# Patient Record
Sex: Male | Born: 1960 | Race: White | Hispanic: No | Marital: Married | Smoking: Never smoker
Health system: Southern US, Community
[De-identification: ages and names within clinical notes are randomized; demographics above are authoritative.]

## PROBLEM LIST (undated history)

## (undated) DIAGNOSIS — E119 Type 2 diabetes mellitus without complications: Secondary | ICD-10-CM

## (undated) HISTORY — PX: ROTATOR CUFF REPAIR: SHX139

## (undated) HISTORY — DX: Type 2 diabetes mellitus without complications: E11.9

---
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
## 2004-10-30 ENCOUNTER — Ambulatory Visit (HOSPITAL_COMMUNITY): Admission: RE | Admit: 2004-10-30 | Discharge: 2004-10-30 | Payer: Self-pay | Admitting: Orthopedic Surgery

## 2004-10-30 ENCOUNTER — Ambulatory Visit (HOSPITAL_BASED_OUTPATIENT_CLINIC_OR_DEPARTMENT_OTHER): Admission: RE | Admit: 2004-10-30 | Discharge: 2004-10-30 | Payer: Self-pay | Admitting: Orthopedic Surgery

## 2005-02-01 ENCOUNTER — Ambulatory Visit (HOSPITAL_COMMUNITY): Admission: RE | Admit: 2005-02-01 | Discharge: 2005-02-01 | Payer: Self-pay | Admitting: Orthopedic Surgery

## 2005-02-01 ENCOUNTER — Ambulatory Visit (HOSPITAL_BASED_OUTPATIENT_CLINIC_OR_DEPARTMENT_OTHER): Admission: RE | Admit: 2005-02-01 | Discharge: 2005-02-01 | Payer: Self-pay | Admitting: Orthopedic Surgery

## 2005-03-15 HISTORY — PX: ROTATOR CUFF REPAIR: SHX139

## 2012-04-21 ENCOUNTER — Encounter (INDEPENDENT_AMBULATORY_CARE_PROVIDER_SITE_OTHER): Payer: 59 | Admitting: Ophthalmology

## 2012-04-21 DIAGNOSIS — E1039 Type 1 diabetes mellitus with other diabetic ophthalmic complication: Secondary | ICD-10-CM

## 2012-04-21 DIAGNOSIS — E1065 Type 1 diabetes mellitus with hyperglycemia: Secondary | ICD-10-CM

## 2012-04-21 DIAGNOSIS — H43819 Vitreous degeneration, unspecified eye: Secondary | ICD-10-CM

## 2012-04-21 DIAGNOSIS — H2510 Age-related nuclear cataract, unspecified eye: Secondary | ICD-10-CM

## 2012-04-21 DIAGNOSIS — H3581 Retinal edema: Secondary | ICD-10-CM

## 2012-04-21 DIAGNOSIS — E11319 Type 2 diabetes mellitus with unspecified diabetic retinopathy without macular edema: Secondary | ICD-10-CM

## 2012-05-05 ENCOUNTER — Ambulatory Visit (INDEPENDENT_AMBULATORY_CARE_PROVIDER_SITE_OTHER): Payer: 59 | Admitting: Ophthalmology

## 2012-05-05 DIAGNOSIS — E1165 Type 2 diabetes mellitus with hyperglycemia: Secondary | ICD-10-CM

## 2012-05-05 DIAGNOSIS — E1139 Type 2 diabetes mellitus with other diabetic ophthalmic complication: Secondary | ICD-10-CM

## 2012-05-05 DIAGNOSIS — H3581 Retinal edema: Secondary | ICD-10-CM

## 2012-09-08 ENCOUNTER — Ambulatory Visit (INDEPENDENT_AMBULATORY_CARE_PROVIDER_SITE_OTHER): Payer: 59 | Admitting: Ophthalmology

## 2012-09-08 DIAGNOSIS — H2510 Age-related nuclear cataract, unspecified eye: Secondary | ICD-10-CM

## 2012-09-08 DIAGNOSIS — E1039 Type 1 diabetes mellitus with other diabetic ophthalmic complication: Secondary | ICD-10-CM

## 2012-09-08 DIAGNOSIS — E11319 Type 2 diabetes mellitus with unspecified diabetic retinopathy without macular edema: Secondary | ICD-10-CM

## 2012-09-08 DIAGNOSIS — H43819 Vitreous degeneration, unspecified eye: Secondary | ICD-10-CM

## 2012-11-17 ENCOUNTER — Encounter: Payer: Self-pay | Admitting: Gastroenterology

## 2013-01-01 ENCOUNTER — Ambulatory Visit (AMBULATORY_SURGERY_CENTER): Payer: Self-pay | Admitting: *Deleted

## 2013-01-01 VITALS — Ht 69.5 in | Wt 171.2 lb

## 2013-01-01 DIAGNOSIS — Z1211 Encounter for screening for malignant neoplasm of colon: Secondary | ICD-10-CM

## 2013-01-01 MED ORDER — MOVIPREP 100 G PO SOLR: ORAL | Status: DC

## 2013-01-01 NOTE — Progress Notes (Signed)
No allergies to eggs or soy. No problems with anesthesia.  

## 2013-01-02 ENCOUNTER — Encounter: Payer: Self-pay | Admitting: Gastroenterology

## 2013-01-22 ENCOUNTER — Encounter: Payer: Self-pay | Admitting: Gastroenterology

## 2013-01-22 ENCOUNTER — Ambulatory Visit (AMBULATORY_SURGERY_CENTER): Payer: 59 | Admitting: Gastroenterology

## 2013-01-22 VITALS — BP 103/64 | HR 79 | Temp 97.1°F | Resp 18 | Ht 69.5 in | Wt 171.0 lb

## 2013-01-22 DIAGNOSIS — Z1211 Encounter for screening for malignant neoplasm of colon: Secondary | ICD-10-CM

## 2013-01-22 LAB — GLUCOSE, CAPILLARY
Glucose-Capillary: 110 mg/dL — ABNORMAL HIGH (ref 70–99)
Glucose-Capillary: 105 mg/dL — ABNORMAL HIGH (ref 70–99)

## 2013-01-22 MED ORDER — SODIUM CHLORIDE 0.9 % IV SOLN: 500.0000 mL | INTRAVENOUS | Status: DC

## 2013-01-22 NOTE — Progress Notes (Signed)
Blood pressure dropped to 85/51.  IV fluids at wide open drip.  Pt warm, dry and pink.  Retook b/p remained the same 85/51.  Maw

## 2013-01-22 NOTE — Op Note (Signed)
[facility] [address] [place], [address]   COLONOSCOPY PROCEDURE REPORT  PATIENT: Dale Newton, Dale Newton  MR#: [id number] BIRTHDATE: [birth year] , 52  yrs. old GENDER: Male ENDOSCOPIST: Rachael Fee, MD REFERRED XB:JYNWG Link Snuffer, M.D. PROCEDURE DATE:  01/22/2013 PROCEDURE:   Colonoscopy, screening First Screening Colonoscopy - Avg.  risk and is 50 yrs.  old or older Yes.  Prior Negative Screening - Now for repeat screening. N/A  History of Adenoma - Now for follow-up colonoscopy & has been > or = to 3 yrs.  N/A  Polyps Removed Today? No.  Recommend repeat exam, <10 yrs? No. ASA CLASS:   Class II INDICATIONS:average risk screening. MEDICATIONS: Versed 6 mg IV, Fentanyl 75 mcg IV, and These medications were titrated to patient response per physician's verbal order  DESCRIPTION OF PROCEDURE:   After the risks benefits and alternatives of the procedure were thoroughly explained, informed consent was obtained.  A digital rectal exam revealed no abnormalities of the rectum.   The LB [id number]  endoscope was introduced through the anus and advanced to the cecum, which was identified by both the appendix and ileocecal valve. No adverse events experienced.   The quality of the prep was good.  The instrument was then slowly withdrawn as the colon was fully examined.  COLON FINDINGS: A normal appearing cecum, ileocecal valve, and appendiceal orifice were identified.  The ascending, hepatic flexure, transverse, splenic flexure, descending, sigmoid colon and rectum appeared unremarkable.  No polyps or cancers were seen. Retroflexed views revealed no abnormalities. The time to cecum=2 minutes 42 seconds.  Withdrawal time=7 minutes 04 seconds.  The scope was withdrawn and the procedure completed. COMPLICATIONS: There were no complications.  ENDOSCOPIC IMPRESSION: Normal colon No polyps or cancers  RECOMMENDATIONS: You should continue to follow colorectal cancer  screening guidelines for "routine risk" patients with a repeat colonoscopy in 10 years.   eSigned:  Rachael Fee, MD 01/22/2013 8:51 AM

## 2013-01-22 NOTE — Progress Notes (Signed)
Patient did not experience any of the following events: a burn prior to discharge; a fall within the facility; wrong site/side/patient/procedure/implant event; or a hospital transfer or hospital admission upon discharge from the facility. (G8907) Patient did not have preoperative order for IV antibiotic SSI prophylaxis. (G8918)  

## 2013-01-22 NOTE — Patient Instructions (Signed)
Discharge instructions given with verbal understanding. Normal exam. Resume previous medications. YOU HAD AN ENDOSCOPIC PROCEDURE TODAY AT THE [facility]: Refer to the procedure report that was given to you for any specific questions about what was found during the examination.  If the procedure report does not answer your questions, please call your gastroenterologist to clarify.  If you requested that your care partner not be given the details of your procedure findings, then the procedure report has been included in a sealed envelope for you to review at your convenience later.  YOU SHOULD EXPECT: Some feelings of bloating in the abdomen. Passage of more gas than usual.  Walking can help get rid of the air that was put into your GI tract during the procedure and reduce the bloating. If you had a lower endoscopy (such as a colonoscopy or flexible sigmoidoscopy) you may notice spotting of blood in your stool or on the toilet paper. If you underwent a bowel prep for your procedure, then you may not have a normal bowel movement for a few days.  DIET: Your first meal following the procedure should be a light meal and then it is ok to progress to your normal diet.  A half-sandwich or bowl of soup is an example of a good first meal.  Heavy or fried foods are harder to digest and may make you feel nauseous or bloated.  Likewise meals heavy in dairy and vegetables can cause extra gas to form and this can also increase the bloating.  Drink plenty of fluids but you should avoid alcoholic beverages for 24 hours.  ACTIVITY: Your care partner should take you home directly after the procedure.  You should plan to take it easy, moving slowly for the rest of the day.  You can resume normal activity the day after the procedure however you should NOT DRIVE or use heavy machinery for 24 hours (because of the sedation medicines used during the test).    SYMPTOMS TO REPORT IMMEDIATELY: A gastroenterologist  can be reached at any hour.  During normal business hours, 8:30 AM to 5:00 PM Monday through Friday, call [phone].  After hours and on weekends, please call the GI answering service at [phone] who will take a message and have the physician on call contact you.   Following lower endoscopy (colonoscopy or flexible sigmoidoscopy):  Excessive amounts of blood in the stool  Significant tenderness or worsening of abdominal pains  Swelling of the abdomen that is new, acute  Fever of 100F or higher  FOLLOW UP: If any biopsies were taken you will be contacted by phone or by letter within the next 1-3 weeks.  Call your gastroenterologist if you have not heard about the biopsies in 3 weeks.  Our staff will call the home number listed on your records the next business day following your procedure to check on you and address any questions or concerns that you may have at that time regarding the information given to you following your procedure. This is a courtesy call and so if there is no answer at the home number and we have not heard from you through the emergency physician on call, we will assume that you have returned to your regular daily activities without incident.  SIGNATURES/CONFIDENTIALITY: You and/or your care partner have signed paperwork which will be entered into your electronic medical record.  These signatures attest to the fact that that the information above on your After Visit Summary has been reviewed   and is understood.  Full responsibility of the confidentiality of this discharge information lies with you and/or your care-partner. 

## 2013-01-22 NOTE — Progress Notes (Signed)
Last blood pressure before transported to the recovery room 95/45.  W,D,P still. Pt tolerated the colonoscopy well. Maw

## 2013-01-23 ENCOUNTER — Telehealth: Payer: Self-pay | Admitting: *Deleted

## 2013-01-23 NOTE — Telephone Encounter (Signed)
  Follow up Call-  Call back number 01/22/2013  Post procedure Call Back phone  # [phone] OK TO SPEAK WITH WIFE  Permission to leave phone message Yes     Left message on answering machine to call us back if experiencing problems or has questions

## 2013-03-30 ENCOUNTER — Ambulatory Visit (INDEPENDENT_AMBULATORY_CARE_PROVIDER_SITE_OTHER): Payer: 59 | Admitting: Ophthalmology

## 2013-03-30 DIAGNOSIS — E11319 Type 2 diabetes mellitus with unspecified diabetic retinopathy without macular edema: Secondary | ICD-10-CM

## 2013-03-30 DIAGNOSIS — H43819 Vitreous degeneration, unspecified eye: Secondary | ICD-10-CM

## 2013-03-30 DIAGNOSIS — E1165 Type 2 diabetes mellitus with hyperglycemia: Secondary | ICD-10-CM

## 2013-03-30 DIAGNOSIS — H2510 Age-related nuclear cataract, unspecified eye: Secondary | ICD-10-CM

## 2013-03-30 DIAGNOSIS — E1139 Type 2 diabetes mellitus with other diabetic ophthalmic complication: Secondary | ICD-10-CM

## 2013-09-28 ENCOUNTER — Ambulatory Visit (INDEPENDENT_AMBULATORY_CARE_PROVIDER_SITE_OTHER): Payer: 59 | Admitting: Ophthalmology

## 2013-09-28 DIAGNOSIS — E1139 Type 2 diabetes mellitus with other diabetic ophthalmic complication: Secondary | ICD-10-CM

## 2013-09-28 DIAGNOSIS — H2510 Age-related nuclear cataract, unspecified eye: Secondary | ICD-10-CM

## 2013-09-28 DIAGNOSIS — E11319 Type 2 diabetes mellitus with unspecified diabetic retinopathy without macular edema: Secondary | ICD-10-CM

## 2013-09-28 DIAGNOSIS — H43819 Vitreous degeneration, unspecified eye: Secondary | ICD-10-CM

## 2013-09-28 DIAGNOSIS — E1165 Type 2 diabetes mellitus with hyperglycemia: Secondary | ICD-10-CM

## 2014-10-04 ENCOUNTER — Ambulatory Visit (INDEPENDENT_AMBULATORY_CARE_PROVIDER_SITE_OTHER): Payer: 59 | Admitting: Ophthalmology

## 2014-10-04 DIAGNOSIS — H43813 Vitreous degeneration, bilateral: Secondary | ICD-10-CM | POA: Diagnosis not present

## 2014-10-04 DIAGNOSIS — E10319 Type 1 diabetes mellitus with unspecified diabetic retinopathy without macular edema: Secondary | ICD-10-CM

## 2014-10-04 DIAGNOSIS — E10329 Type 1 diabetes mellitus with mild nonproliferative diabetic retinopathy without macular edema: Secondary | ICD-10-CM

## 2015-10-10 ENCOUNTER — Ambulatory Visit (INDEPENDENT_AMBULATORY_CARE_PROVIDER_SITE_OTHER): Payer: 59 | Admitting: Ophthalmology

## 2015-10-10 DIAGNOSIS — E103392 Type 1 diabetes mellitus with moderate nonproliferative diabetic retinopathy without macular edema, left eye: Secondary | ICD-10-CM | POA: Diagnosis not present

## 2015-10-10 DIAGNOSIS — E10319 Type 1 diabetes mellitus with unspecified diabetic retinopathy without macular edema: Secondary | ICD-10-CM

## 2015-10-10 DIAGNOSIS — H43813 Vitreous degeneration, bilateral: Secondary | ICD-10-CM | POA: Diagnosis not present

## 2015-10-10 DIAGNOSIS — E103291 Type 1 diabetes mellitus with mild nonproliferative diabetic retinopathy without macular edema, right eye: Secondary | ICD-10-CM | POA: Diagnosis not present

## 2015-12-06 ENCOUNTER — Emergency Department (HOSPITAL_COMMUNITY): Payer: 59

## 2015-12-06 ENCOUNTER — Encounter (HOSPITAL_COMMUNITY)
Admission: EM | Disposition: A | Discharge status: Home / Self Care | Payer: Self-pay | Source: Home / Self Care | Attending: Otolaryngology

## 2015-12-06 ENCOUNTER — Inpatient Hospital Stay (HOSPITAL_COMMUNITY)
Admission: EM | Admit: 2015-12-06 | Discharge: 2015-12-08 | DRG: 155 | Disposition: A | Discharge status: Home / Self Care | Payer: 59 | Attending: Otolaryngology | Admitting: Otolaryngology

## 2015-12-06 ENCOUNTER — Observation Stay (HOSPITAL_COMMUNITY): Payer: 59 | Admitting: Certified Registered"

## 2015-12-06 ENCOUNTER — Encounter (HOSPITAL_COMMUNITY): Payer: Self-pay | Admitting: *Deleted

## 2015-12-06 DIAGNOSIS — L03211 Cellulitis of face: Secondary | ICD-10-CM

## 2015-12-06 DIAGNOSIS — Z7982 Long term (current) use of aspirin: Secondary | ICD-10-CM

## 2015-12-06 DIAGNOSIS — J340 Abscess, furuncle and carbuncle of nose: Secondary | ICD-10-CM | POA: Diagnosis not present

## 2015-12-06 DIAGNOSIS — IMO0001 Reserved for inherently not codable concepts without codable children: Secondary | ICD-10-CM

## 2015-12-06 DIAGNOSIS — E119 Type 2 diabetes mellitus without complications: Secondary | ICD-10-CM | POA: Diagnosis present

## 2015-12-06 DIAGNOSIS — Z794 Long term (current) use of insulin: Secondary | ICD-10-CM

## 2015-12-06 DIAGNOSIS — Z79899 Other long term (current) drug therapy: Secondary | ICD-10-CM

## 2015-12-06 HISTORY — PX: INCISION AND DRAINAGE ABSCESS: SHX5864

## 2015-12-06 LAB — CBC WITH DIFFERENTIAL/PLATELET
BASOS ABS: 0.1 10*3/uL (ref 0.0–0.1)
Basophils Relative: 0 %
Eosinophils Absolute: 0 10*3/uL (ref 0.0–0.7)
Eosinophils Relative: 0 %
HEMATOCRIT: 47 % (ref 39.0–52.0)
Hemoglobin: 15.8 g/dL (ref 13.0–17.0)
LYMPHS ABS: 2 10*3/uL (ref 0.7–4.0)
LYMPHS PCT: 13 %
MCH: 31.5 pg (ref 26.0–34.0)
MCHC: 33.6 g/dL (ref 30.0–36.0)
MCV: 93.8 fL (ref 78.0–100.0)
Monocytes Absolute: 1.2 10*3/uL — ABNORMAL HIGH (ref 0.1–1.0)
Monocytes Relative: 8 %
NEUTROS ABS: 11.4 10*3/uL — AB (ref 1.7–7.7)
Neutrophils Relative %: 79 %
Platelets: 315 10*3/uL (ref 150–400)
RBC: 5.01 MIL/uL (ref 4.22–5.81)
RDW: 12 % (ref 11.5–15.5)
WBC: 14.7 10*3/uL — AB (ref 4.0–10.5)

## 2015-12-06 LAB — BASIC METABOLIC PANEL
ANION GAP: 14 (ref 5–15)
BUN: 10 mg/dL (ref 6–20)
CHLORIDE: 100 mmol/L — AB (ref 101–111)
CO2: 22 mmol/L (ref 22–32)
Calcium: 9.5 mg/dL (ref 8.9–10.3)
Creatinine, Ser: 1.04 mg/dL (ref 0.61–1.24)
GFR calc Af Amer: >60 mL/min (ref >60)
GFR calc non Af Amer: >60 mL/min (ref >60)
GLUCOSE: 142 mg/dL — AB (ref 65–99)
POTASSIUM: 4.3 mmol/L (ref 3.5–5.1)
Sodium: 136 mmol/L (ref 135–145)

## 2015-12-06 LAB — GLUCOSE, CAPILLARY: GLUCOSE-CAPILLARY: 142 mg/dL — AB (ref 65–99)

## 2015-12-06 LAB — I-STAT CG4 LACTIC ACID, ED
Lactic Acid, Venous: 1.69 mmol/L (ref 0.5–1.9)
Lactic Acid, Venous: 1.83 mmol/L (ref 0.5–1.9)

## 2015-12-06 SURGERY — INCISION AND DRAINAGE, ABSCESS
Anesthesia: General | Site: Nose

## 2015-12-06 MED ORDER — ONDANSETRON HCL 4 MG/2ML IJ SOLN
4.0000 mg | Freq: Once | INTRAMUSCULAR | Status: AC
  Administered 2015-12-06: 4 mg via INTRAVENOUS
  Filled 2015-12-06: qty 2

## 2015-12-06 MED ORDER — OXYCODONE HCL 5 MG/5ML PO SOLN: 5.0000 mg | Freq: Once | ORAL | Status: DC | PRN

## 2015-12-06 MED ORDER — MUPIROCIN 2 % EX OINT
TOPICAL_OINTMENT | Freq: Two times a day (BID) | CUTANEOUS | Status: DC
  Administered 2015-12-07 (×3): via NASAL
  Filled 2015-12-06 (×2): qty 22

## 2015-12-06 MED ORDER — LIDOCAINE-EPINEPHRINE 1 %-1:100000 IJ SOLN
INTRAMUSCULAR | Status: DC | PRN
  Administered 2015-12-06: 3 mL

## 2015-12-06 MED ORDER — PROPOFOL 10 MG/ML IV BOLUS
INTRAVENOUS | Status: AC
  Filled 2015-12-06: qty 20

## 2015-12-06 MED ORDER — INSULIN ASPART 100 UNIT/ML ~~LOC~~ SOLN
0.0000 [IU] | Freq: Every day | SUBCUTANEOUS | Status: DC
  Administered 2015-12-07: 3 [IU] via SUBCUTANEOUS

## 2015-12-06 MED ORDER — ONDANSETRON HCL 4 MG/2ML IJ SOLN: 4.0000 mg | Freq: Once | INTRAMUSCULAR | Status: DC | PRN

## 2015-12-06 MED ORDER — LACTATED RINGERS IV SOLN
INTRAVENOUS | Status: DC | PRN
Start: 2015-12-06 — End: 2015-12-06
  Administered 2015-12-06: 22:00:00 via INTRAVENOUS

## 2015-12-06 MED ORDER — SUCCINYLCHOLINE CHLORIDE 200 MG/10ML IV SOSY
PREFILLED_SYRINGE | INTRAVENOUS | Status: AC
  Filled 2015-12-06: qty 10

## 2015-12-06 MED ORDER — MORPHINE SULFATE (PF) 2 MG/ML IV SOLN: 2.0000 mg | INTRAVENOUS | Status: DC | PRN

## 2015-12-06 MED ORDER — ACETAMINOPHEN 500 MG PO TABS: 1000.0000 mg | ORAL_TABLET | Freq: Every day | ORAL | Status: DC | PRN

## 2015-12-06 MED ORDER — HYDROMORPHONE HCL 1 MG/ML IJ SOLN
0.5000 mg | Freq: Once | INTRAMUSCULAR | Status: AC
Start: 2015-12-06 — End: 2015-12-06
  Administered 2015-12-06: 0.5 mg via INTRAVENOUS
  Filled 2015-12-06: qty 1

## 2015-12-06 MED ORDER — LIDOCAINE-EPINEPHRINE 1 %-1:100000 IJ SOLN
INTRAMUSCULAR | Status: AC
  Filled 2015-12-06: qty 1

## 2015-12-06 MED ORDER — VANCOMYCIN HCL IN DEXTROSE 750-5 MG/150ML-% IV SOLN
750.0000 mg | Freq: Three times a day (TID) | INTRAVENOUS | Status: DC
  Administered 2015-12-07 – 2015-12-08 (×4): 750 mg via INTRAVENOUS
  Filled 2015-12-06 (×6): qty 150

## 2015-12-06 MED ORDER — LIDOCAINE 2% (20 MG/ML) 5 ML SYRINGE
INTRAMUSCULAR | Status: AC
  Filled 2015-12-06: qty 5

## 2015-12-06 MED ORDER — OXYCODONE HCL 5 MG PO TABS: 5.0000 mg | ORAL_TABLET | Freq: Once | ORAL | Status: DC | PRN

## 2015-12-06 MED ORDER — POTASSIUM CHLORIDE IN NACL 20-0.45 MEQ/L-% IV SOLN
INTRAVENOUS | Status: DC
  Administered 2015-12-07 (×2): via INTRAVENOUS
  Filled 2015-12-06 (×3): qty 1000

## 2015-12-06 MED ORDER — SUFENTANIL CITRATE 50 MCG/ML IV SOLN
INTRAVENOUS | Status: DC | PRN
  Administered 2015-12-06 (×2): 5 ug via INTRAVENOUS

## 2015-12-06 MED ORDER — ASPIRIN 81 MG PO TABS: 81.0000 mg | ORAL_TABLET | ORAL | Status: DC

## 2015-12-06 MED ORDER — HYDROMORPHONE HCL 1 MG/ML IJ SOLN
INTRAMUSCULAR | Status: AC
  Filled 2015-12-06: qty 1

## 2015-12-06 MED ORDER — INSULIN ASPART 100 UNIT/ML ~~LOC~~ SOLN
0.0000 [IU] | Freq: Three times a day (TID) | SUBCUTANEOUS | Status: DC
  Administered 2015-12-07: 10 [IU] via SUBCUTANEOUS
  Administered 2015-12-07: 3 [IU] via SUBCUTANEOUS
  Administered 2015-12-07: 7 [IU] via SUBCUTANEOUS
  Administered 2015-12-08: 2 [IU] via SUBCUTANEOUS

## 2015-12-06 MED ORDER — EZETIMIBE 10 MG PO TABS: 10.0000 mg | ORAL_TABLET | Freq: Every day | ORAL | Status: DC

## 2015-12-06 MED ORDER — ONDANSETRON HCL 4 MG/2ML IJ SOLN
INTRAMUSCULAR | Status: DC | PRN
  Administered 2015-12-06: 4 mg via INTRAVENOUS

## 2015-12-06 MED ORDER — MIDAZOLAM HCL 2 MG/2ML IJ SOLN
INTRAMUSCULAR | Status: DC | PRN
  Administered 2015-12-06: 2 mg via INTRAVENOUS

## 2015-12-06 MED ORDER — PIPERACILLIN-TAZOBACTAM 3.375 G IVPB 30 MIN
3.3750 g | Freq: Once | INTRAVENOUS | Status: AC
  Administered 2015-12-06: 3.375 g via INTRAVENOUS
  Filled 2015-12-06: qty 50

## 2015-12-06 MED ORDER — ATORVASTATIN CALCIUM 40 MG PO TABS: 40.0000 mg | ORAL_TABLET | Freq: Every day | ORAL | Status: DC

## 2015-12-06 MED ORDER — HYDROMORPHONE HCL 1 MG/ML IJ SOLN
0.2500 mg | INTRAMUSCULAR | Status: DC | PRN
  Administered 2015-12-06 (×3): 0.5 mg via INTRAVENOUS

## 2015-12-06 MED ORDER — SUFENTANIL CITRATE 50 MCG/ML IV SOLN
INTRAVENOUS | Status: AC
  Filled 2015-12-06: qty 1

## 2015-12-06 MED ORDER — ONDANSETRON HCL 4 MG PO TABS: 4.0000 mg | ORAL_TABLET | ORAL | Status: DC | PRN

## 2015-12-06 MED ORDER — NAPROXEN SODIUM 220 MG PO TABS: 220.0000 mg | ORAL_TABLET | ORAL | Status: DC

## 2015-12-06 MED ORDER — SODIUM CHLORIDE 0.9 % IV BOLUS (SEPSIS)
1000.0000 mL | Freq: Once | INTRAVENOUS | Status: AC
  Administered 2015-12-06: 1000 mL via INTRAVENOUS

## 2015-12-06 MED ORDER — IOPAMIDOL (ISOVUE-300) INJECTION 61%
INTRAVENOUS | Status: AC
  Administered 2015-12-06: 75 mL
  Filled 2015-12-06: qty 75

## 2015-12-06 MED ORDER — ONDANSETRON HCL 4 MG/2ML IJ SOLN
INTRAMUSCULAR | Status: AC
  Filled 2015-12-06: qty 2

## 2015-12-06 MED ORDER — 0.9 % SODIUM CHLORIDE (POUR BTL) OPTIME
TOPICAL | Status: DC | PRN
  Administered 2015-12-06: 200 mL

## 2015-12-06 MED ORDER — ONDANSETRON HCL 4 MG/2ML IJ SOLN: 4.0000 mg | INTRAMUSCULAR | Status: DC | PRN

## 2015-12-06 MED ORDER — OXYCODONE-ACETAMINOPHEN 5-325 MG PO TABS: 2.0000 | ORAL_TABLET | ORAL | Status: DC | PRN

## 2015-12-06 MED ORDER — HYDROCODONE-ACETAMINOPHEN 5-325 MG PO TABS
1.0000 | ORAL_TABLET | ORAL | Status: DC | PRN
  Administered 2015-12-07 (×2): 2 via ORAL
  Administered 2015-12-07: 1 via ORAL
  Administered 2015-12-07: 2 via ORAL
  Filled 2015-12-06 (×4): qty 2

## 2015-12-06 MED ORDER — VANCOMYCIN HCL 10 G IV SOLR
1500.0000 mg | Freq: Once | INTRAVENOUS | Status: AC
  Administered 2015-12-06: 1500 mg via INTRAVENOUS
  Filled 2015-12-06: qty 1500

## 2015-12-06 MED ORDER — PIPERACILLIN-TAZOBACTAM 3.375 G IVPB
3.3750 g | Freq: Three times a day (TID) | INTRAVENOUS | Status: DC
  Administered 2015-12-07 – 2015-12-08 (×4): 3.375 g via INTRAVENOUS
  Filled 2015-12-06 (×6): qty 50

## 2015-12-06 MED ORDER — MIDAZOLAM HCL 2 MG/2ML IJ SOLN
INTRAMUSCULAR | Status: AC
  Filled 2015-12-06: qty 2

## 2015-12-06 SURGICAL SUPPLY — 32 items
ADH SRG 12 PREFL SYR 3 SPRDR (MISCELLANEOUS) ×2
BLADE 15 SAFETY STRL DISP (BLADE) ×2 IMPLANT
CANISTER SUCTION 2500CC (MISCELLANEOUS) ×3 IMPLANT
CATH ROBINSON RED A/P 12FR (CATHETERS) ×2 IMPLANT
COAGULATOR SUCT 8FR VV (MISCELLANEOUS) ×3 IMPLANT
COVER MAYO STAND STRL (DRAPES) ×3 IMPLANT
COVER TABLE BACK 60X90 (DRAPES) ×3 IMPLANT
DECANTER SPIKE VIAL GLASS SM (MISCELLANEOUS) ×2 IMPLANT
DRAIN PENROSE 1/4X12 LTX STRL (WOUND CARE) ×2 IMPLANT
DRAPE ORTHO SPLIT 77X108 STRL (DRAPES) ×3
DRAPE SURG ORHT 6 SPLT 77X108 (DRAPES) ×1 IMPLANT
GAUZE SPONGE 2X2 8PLY STRL LF (GAUZE/BANDAGES/DRESSINGS) ×2 IMPLANT
GAUZE SPONGE 4X4 16PLY XRAY LF (GAUZE/BANDAGES/DRESSINGS) ×3 IMPLANT
GLOVE ECLIPSE 7.5 STRL STRAW (GLOVE) ×3 IMPLANT
GOWN STRL REUS W/ TWL LRG LVL3 (GOWN DISPOSABLE) ×4 IMPLANT
GOWN STRL REUS W/TWL LRG LVL3 (GOWN DISPOSABLE) ×6
KIT BASIN OR (CUSTOM PROCEDURE TRAY) ×3 IMPLANT
KIT ROOM TURNOVER OR (KITS) ×3 IMPLANT
NDL HYPO 25GX1X1/2 BEV (NEEDLE) IMPLANT
NEEDLE HYPO 25GX1X1/2 BEV (NEEDLE) ×3 IMPLANT
NS IRRIG 1000ML POUR BTL (IV SOLUTION) ×3 IMPLANT
PAD ARMBOARD 7.5X6 YLW CONV (MISCELLANEOUS) ×6 IMPLANT
PATTIES SURGICAL .5 X3 (DISPOSABLE) ×3 IMPLANT
PENCIL BUTTON HOLSTER BLD 10FT (ELECTRODE) ×2 IMPLANT
SPONGE GAUZE 2X2 STER 10/PKG (GAUZE/BANDAGES/DRESSINGS) ×1
SUT SILK 2 0 SH (SUTURE) ×2 IMPLANT
SYR 10ML KIT SKIN ADHESIVE (MISCELLANEOUS) ×2 IMPLANT
SYR BULB IRRIGATION 50ML (SYRINGE) ×2 IMPLANT
SYR CONTROL 10ML LL (SYRINGE) IMPLANT
TOWEL OR 17X24 6PK STRL BLUE (TOWEL DISPOSABLE) ×6 IMPLANT
TUBE CONNECTING 12X1/4 (SUCTIONS) ×3 IMPLANT
WATER STERILE IRR 1000ML POUR (IV SOLUTION) ×3 IMPLANT

## 2015-12-06 NOTE — Op Note (Signed)
NAMMarland Kitchen:  Glenna FellowsGIBSON, Jeshua                ACCOUNT NO.:  [id number]  MEDICAL RECORD NO.:  [id number]  LOCATION:  [facility]                         FACILITY:  [facility]  PHYSICIAN:  Antony Contraswight D Safiya Girdler, MD     DATE OF BIRTH:  [birth year]  DATE OF PROCEDURE:  12/06/2015 DATE OF DISCHARGE:                              OPERATIVE REPORT   PREOPERATIVE DIAGNOSIS:  Nasal abscess.  POSTOPERATIVE DIAGNOSIS:  Nasal abscess.  PROCEDURE:  Incision and drainage of nasal abscess.  SURGEON:  Antony Contraswight D Keonta Monceaux, MD  ANESTHESIA:  General LMA anesthesia.  COMPLICATIONS:  None.  INDICATION:  The patient is a 55 year old male, who has developed swelling and pain of the nasal tip over the past week and was started on Bactrim and mupirocin ointment a couple of days ago, but has had progression.  He presents to the emergency department, where a CT scan suggested a fluid pocket in the nasal tip.  He presents to the operating room for surgical management.  FINDINGS:  There was a fluctuant space palpated within the nasal tip and columella, and this was approached from a sublabial direction entering a pocket of thin pus that was able to be drained.  Cultures were sent.  DESCRIPTION OF PROCEDURE:  The patient was identified in the holding room, informed consent having been obtained including discussion of risks, benefits, alternatives, the patient was brought to the operative suite and put on the operative table in a supine position.  Anesthesia was induced, and the patient was intubated by the Anesthesia team with an LMA without difficulty.  The patient had received intravenous antibiotics in the emergency department.  The eyes taped closed.  The mid face was prepped and draped in sterile fashion.  A sublabial site was injected with 1% lidocaine with 1:100,000 of epinephrine.  Incision was made to the left of midline in the gingivobuccal sulcus using Bovie electrocautery.  The submucosal tissues were divided in the same way.   A hemostat was then used to bluntly dissect toward the base of the nasal septum and the pocket of pus was able to be entered in this way.  The hemostat was then passed to either side of the septal cartilage and pus was able to be drained out.  Culture swabs were passed into the cavity and sent for culture.  The space was then copiously irrigated with saline using a red rubber catheter into the space.  A 0.25-inch Penrose drain was then placed into the space from a sublabial direction and secured with a single 2-0 silk suture.  The drain was cut and the throat was suctioned.  He was then returned to anesthesia for wake up and was extubated, and moved to recovery room in stable condition.     Antony Contraswight D Kahealani Yankovich, MD     DDB/MEDQ  D:  12/06/2015  T:  12/06/2015  Job:  [id number]

## 2015-12-06 NOTE — ED Notes (Signed)
Paged Dr. Jenne PaneBates to MillerAbigail, GeorgiaPA

## 2015-12-06 NOTE — Progress Notes (Signed)
Pharmacy Antibiotic Note  Dale Newton is a 55 y.o. male admitted on 12/06/2015 with cellulitis.  Pharmacy has been consulted for vancomycin/zosyn dosing. Recently discharged on Bactrim 2 days ago. Afebrile, wbc 14.7. SCr 1.04 on admit (unsure of baseline), CrCl~88. Estimated weight in the ED ~77kg.  Plan: Zosyn 3.375g IV (30min inf) x1; then 3.375g IV q8h (4h inf) Vanc 1500mg  IV x1; then 750mg  IV q8h Monitor clinical progress, c/s, renal function, abx plan/LOT VT@SS  as indicated     Temp (24hrs), Avg:99 F (37.2 C), Min:99 F (37.2 C), Max:99 F (37.2 C)   Recent Labs Lab 12/06/15 1615 12/06/15 1629 12/06/15 1948  WBC 14.7*  --   --   CREATININE 1.04  --   --   LATICACIDVEN  --  1.69 1.83    CrCl cannot be calculated (Unknown ideal weight.).    No Known Allergies  Antimicrobials this admission: 9/23 vanc >>  9/23 zosyn >>   Dose adjustments this admission:   Microbiology results:   Dale Newton, PharmD, [facility] Clinical Pharmacist Pager [phone] 12/06/2015 8:19 PM

## 2015-12-06 NOTE — ED Provider Notes (Signed)
[facility]-EMERGENCY DEPT Provider Note   CSN: [id number] Arrival date & time: 12/06/15  1601     History   Chief Complaint Chief Complaint  Patient presents with  . Facial Injury    HPI Dale FuLarry Newton is a 55 y.o. male who presents to the ED for nasal infection the patient who presents to the ED with cc of nasal injury and conce for infection. The patient hit his nose with a box at work on Monday . He has had worsening pain and swelling. He saw Dr. Lazarus SalinesWolicki 2 days ago. He had a  Culture and was started on bactrim and mupirocin. The patient has had worsening pain and swelling since starting abx and Came to the emergency department for evaluation. He states that his blood sugars have been running slightly higher. He denies fevers, chills, myalgias. He has had difficulty sleeping because he is unable to breathe through the nose.  HPI  Past Medical History:  Diagnosis Date  . Diabetes (HCC)     There are no active problems to display for this patient.   Past Surgical History:  Procedure Laterality Date  . ROTATOR CUFF REPAIR Left 2006, 2007  . ROTATOR CUFF REPAIR Right 2007  . TONSILLECTOMY  1969       Home Medications    Prior to Admission medications   Medication Sig Start Date End Date Taking? Authorizing Provider  aspirin 81 MG tablet Take 81 mg by mouth daily.    Historical Provider, MD  atorvastatin (LIPITOR) 40 MG tablet Take 40 mg by mouth daily.    Historical Provider, MD  Canagliflozin (INVOKANA) 300 MG TABS Take by mouth daily.    Historical Provider, MD  ezetimibe (ZETIA) 10 MG tablet Take 10 mg by mouth daily.    Historical Provider, MD  insulin glargine (LANTUS) 100 UNIT/ML injection Inject 15 Units into the skin at bedtime.    Historical Provider, MD  insulin regular (NOVOLIN R,HUMULIN R) 100 units/mL injection Inject into the skin 3 (three) times daily before meals. Sliding scale    Historical Provider, MD    Family History Family History  Problem Relation Age  of Onset  . Colon cancer Neg Hx     Social History Social History  Substance Use Topics  . Smoking status: Never Smoker  . Smokeless tobacco: Never Used  . Alcohol use 2.4 oz/week    4 Cans of beer per week     Allergies   Review of patient's allergies indicates no known allergies.   Review of Systems Review of Systems  Ten systems reviewed and are negative for acute change, except as noted in the HPI.   Physical Exam Updated Vital Signs BP 171/81 (BP Location: Right Arm)   Pulse 99   Temp 99 F (37.2 C) (Oral)   Resp 14   SpO2 99%   Physical Exam  Constitutional: He appears well-developed and well-nourished. No distress.  HENT:  Head: Normocephalic.  Nose: Sinus tenderness present.    Mouth/Throat: Uvula is midline and oropharynx is clear and moist. No uvula swelling. No oropharyngeal exudate.  Eyes: Conjunctivae are normal. No scleral icterus.  Neck: Normal range of motion. Neck supple.  Cardiovascular: Normal rate, regular rhythm and normal heart sounds.   Pulmonary/Chest: Effort normal and breath sounds normal. No respiratory distress.  Abdominal: Soft. There is no tenderness.  Musculoskeletal: He exhibits no edema.  Neurological: He is alert.  Skin: Skin is warm and dry. He is not diaphoretic.  Psychiatric: His behavior  is normal.  Nursing note and vitals reviewed.    ED Treatments / Results  Labs (all labs ordered are listed, but only abnormal results are displayed) Labs Reviewed  CBC WITH DIFFERENTIAL/PLATELET - Abnormal; Notable for the following:       Result Value   WBC 14.7 (*)    Neutro Abs 11.4 (*)    Monocytes Absolute 1.2 (*)    All other components within normal limits  BASIC METABOLIC PANEL - Abnormal; Notable for the following:    Chloride 100 (*)    Glucose, Bld 142 (*)    All other components within normal limits  I-STAT CG4 LACTIC ACID, ED  I-STAT CG4 LACTIC ACID, ED    EKG  EKG Interpretation None       Radiology No  results found.  Procedures Procedures (including critical care time)  Medications Ordered in ED Medications - No data to display   Initial Impression / Assessment and Plan / ED Course  I have reviewed the triage vital signs and the nursing notes.  Pertinent labs & imaging results that were available during my care of the patient were reviewed by me and considered in my medical decision making (see chart for details).  Clinical Course  Comment By Time  Patient with leukocytosis, it was 15,000. Glucose is 142 and slightly elevated. Afebrile. Patient receiving vanc and Zosyn. He will need admission for IV antibiotics. CT scan is currently pending. Dr. Jenne Pane will care for the patient asks for hospitalist admission. I have placed a consult to admission at this time. Arthor Captain, PA-C 09/23 2018    Patient with suspected abscess of the nasal septum. Dr. Jenne Pane and I have reviewed the images. Patient will be taken to the OR for drain. He is placed on vancomycin and Zosyn. He is improved with pain medications. No significant decline in his status here over the course of his ED workup. He appears safe for admission at this time Final Clinical Impressions(s) / ED Diagnoses   Final diagnoses:  None    New Prescriptions New Prescriptions   No medications on file     Arthor Captain, PA-C 12/06/15 2117    Lorre Nick, MD 12/09/15 [phone]

## 2015-12-06 NOTE — ED Notes (Signed)
Patient returned from CT

## 2015-12-06 NOTE — Consult Note (Signed)
Reason for Consult:Nasal infection Referring Physician: ER  Dale Newton is an 55 y.o. male.  HPI: 55 year old male with diabetes known to Dr. Erik Obey was struck on nose early this week by a box and later developed swelling and redness.  Dr. Erik Obey evaluated him two days ago and did not feel that there was a drainable abscess at the time but performed a culture and prescribed Bactrim and mupirocin ointment.  Over the past two days, swelling and pain have worsened.  He was told to come to the ER.  Past Medical History:  Diagnosis Date  . Diabetes [facility])     Past Surgical History:  Procedure Laterality Date  . ROTATOR CUFF REPAIR Left 2006, 2007  . ROTATOR CUFF REPAIR Right 2007  . TONSILLECTOMY  1969    Family History  Problem Relation Age of Onset  . Colon cancer Neg Hx     Social History:  reports that he has never smoked. He has never used smokeless tobacco. He reports that he drinks about 2.4 oz of alcohol per week . He reports that he does not use drugs.  Allergies: No Known Allergies  Medications: I have reviewed the patient's current medications.  Results for orders placed or performed during the hospital encounter of 12/06/15 (from the past 48 hour(s))  CBC with Differential     Status: Abnormal   Collection Time: 12/06/15  4:15 PM  Result Value Ref Range   WBC 14.7 (H) 4.0 - 10.5 K/uL   RBC 5.01 4.22 - 5.81 MIL/uL   Hemoglobin 15.8 13.0 - 17.0 g/dL   HCT 47.0 39.0 - 52.0 %   MCV 93.8 78.0 - 100.0 fL   MCH 31.5 26.0 - 34.0 pg   MCHC 33.6 30.0 - 36.0 g/dL   RDW 12.0 11.5 - 15.5 %   Platelets 315 150 - 400 K/uL   Neutrophils Relative % 79 %   Neutro Abs 11.4 (H) 1.7 - 7.7 K/uL   Lymphocytes Relative 13 %   Lymphs Abs 2.0 0.7 - 4.0 K/uL   Monocytes Relative 8 %   Monocytes Absolute 1.2 (H) 0.1 - 1.0 K/uL   Eosinophils Relative 0 %   Eosinophils Absolute 0.0 0.0 - 0.7 K/uL   Basophils Relative 0 %   Basophils Absolute 0.1 0.0 - 0.1 K/uL  Basic metabolic panel      Status: Abnormal   Collection Time: 12/06/15  4:15 PM  Result Value Ref Range   Sodium 136 135 - 145 mmol/L   Potassium 4.3 3.5 - 5.1 mmol/L   Chloride 100 (L) 101 - 111 mmol/L   CO2 22 22 - 32 mmol/L   Glucose, Bld 142 (H) 65 - 99 mg/dL   BUN 10 6 - 20 mg/dL   Creatinine, Ser 1.04 0.61 - 1.24 mg/dL   Calcium 9.5 8.9 - 10.3 mg/dL   GFR calc non Af Amer >60 >60 mL/min   GFR calc Af Amer >60 >60 mL/min    Comment: (NOTE) The eGFR has been calculated using the CKD EPI equation. This calculation has not been validated in all clinical situations. eGFR's persistently <60 mL/min signify possible Chronic Kidney Disease.    Anion gap 14 5 - 15  I-Stat CG4 Lactic Acid, ED     Status: None   Collection Time: 12/06/15  4:29 PM  Result Value Ref Range   Lactic Acid, Venous 1.69 0.5 - 1.9 mmol/L  I-Stat CG4 Lactic Acid, ED     Status: None  Collection Time: 12/06/15  7:48 PM  Result Value Ref Range   Lactic Acid, Venous 1.83 0.5 - 1.9 mmol/L    No results found.  Review of Systems  HENT:       Nasal pain  Eyes: Positive for blurred vision.  All other systems reviewed and are negative.  Blood pressure 166/67, pulse 93, temperature 99 F (37.2 C), temperature source Oral, resp. rate 16, SpO2 99 %. Physical Exam  Constitutional: He is oriented to person, place, and time. He appears well-developed and well-nourished. No distress.  HENT:  Head: Normocephalic and atraumatic.  Right Ear: External ear normal.  Left Ear: External ear normal.  Mouth/Throat: Oropharynx is clear and moist.  External nose erythematous mostly about the tip with marked edema and tenderness of columella.  Some crusting in both nasal passages.  Upper lip with some edema but without fluctuance.  Eyes: Conjunctivae and EOM are normal. Pupils are equal, round, and reactive to light.  Neck: Normal range of motion. Neck supple.  Cardiovascular: Normal rate.   Respiratory: Effort normal.  Musculoskeletal: Normal  range of motion.  Neurological: He is alert and oriented to person, place, and time. No cranial nerve deficit.  Skin: Skin is warm and dry.  Psychiatric: He has a normal mood and affect. His behavior is normal. Judgment and thought content normal.    Assessment/Plan: Nasal tip cellulitis/abscess, diabetes The patient's exam and poor response to oral antibiotics are concerning for abscess and the maxillofacial CT, which I personally reviewed, supports this diagnosis.  I discussed the situation with him and his wife and recommended proceeding with incision and drainage under sedation.  Risks, benefits, and alternatives were discussed and he expressed understanding and agreement.  I recommended post-operative intravenous antibiotic therapy.  Hoang Pettingill 12/06/2015, 8:08 PM

## 2015-12-06 NOTE — ED Triage Notes (Signed)
The pt was struck in the nose with a package at work  His nose has become more red and swollen  He was seen by dr Lazarus Salineswolicki  On thursady he was given antibiotics and a culture was done on his nose  The pt thinks that his nose is hurting and looking worse

## 2015-12-06 NOTE — ED Notes (Signed)
Patient transported to CT 

## 2015-12-06 NOTE — Anesthesia Preprocedure Evaluation (Addendum)
Anesthesia Evaluation  Patient identified by MRN, date of birth, ID band Patient awake    Reviewed: Allergy & Precautions, NPO status , Patient's Chart, lab work & pertinent test results  Airway Mallampati: II  TM Distance: >3 FB Neck ROM: Full    Dental  (+) Teeth Intact, Dental Advisory Given   Pulmonary    breath sounds clear to auscultation       Cardiovascular  Rhythm:Regular Rate:Normal     Neuro/Psych    GI/Hepatic   Endo/Other  diabetes, Well Controlled, Type 2, Oral Hypoglycemic Agents  Renal/GU      Musculoskeletal   Abdominal   Peds  Hematology   Anesthesia Other Findings   Reproductive/Obstetrics                            Anesthesia Physical Anesthesia Plan  ASA: III  Anesthesia Plan: General   Post-op Pain Management:    Induction: Intravenous  Airway Management Planned: LMA  Additional Equipment:   Intra-op Plan:   Post-operative Plan:   Informed Consent: I have reviewed the patients History and Physical, chart, labs and discussed the procedure including the risks, benefits and alternatives for the proposed anesthesia with the patient or authorized representative who has indicated his/her understanding and acceptance.   Dental advisory given  Plan Discussed with: CRNA and Anesthesiologist  Anesthesia Plan Comments:         Anesthesia Quick Evaluation

## 2015-12-06 NOTE — Brief Op Note (Signed)
12/06/2015  11:00 PM  PATIENT:  Dale Newton  55 y.o. male  PRE-OPERATIVE DIAGNOSIS:  nasal abscess   POST-OPERATIVE DIAGNOSIS:  nasal abscess  PROCEDURE:  Procedure(s): INCISION AND DRAINAGE NASAL ABSCESS (N/A)  SURGEON:  Surgeon(s) and Role:    * Christia Readingwight Shanasia Ibrahim, MD - Primary  PHYSICIAN ASSISTANT:   ASSISTANTS: none   ANESTHESIA:   general  EBL:  Total I/O In: 1500 [I.V.:500; IV Piggyback:1000] Out: 15 [Blood:15]  BLOOD ADMINISTERED:none  DRAINS: Penrose drain in the sublabial wound   LOCAL MEDICATIONS USED:  LIDOCAINE   SPECIMEN:  Source of Specimen:  culture from nasal abscess  DISPOSITION OF SPECIMEN:  MICRO  COUNTS:  YES  TOURNIQUET:  * No tourniquets in log *  DICTATION: .Other Dictation: Dictation Number [id number]  PLAN OF CARE: Admit for overnight observation  PATIENT DISPOSITION:  PACU - hemodynamically stable.   Delay start of Pharmacological VTE agent (>24hrs) due to surgical blood loss or risk of bleeding: no

## 2015-12-06 NOTE — Transfer of Care (Signed)
Immediate Anesthesia Transfer of Care Note  Patient: Dale Newton  Procedure(s) Performed: Procedure(s): EXCISION NASAL MASS (N/A)  Patient Location: PACU  Anesthesia Type:General  Level of Consciousness: awake, alert  and oriented  Airway & Oxygen Therapy: Patient Spontanous Breathing and Patient connected to face mask oxygen  Post-op Assessment: Report given to RN and Post -op Vital signs reviewed and stable  Post vital signs: Reviewed and stable  Last Vitals:  Vitals:   12/06/15 2100 12/06/15 2254  BP: 153/66 (!) 169/81  Pulse: 94 (!) 110  Resp: 18 14  Temp:      Last Pain:  Vitals:   12/06/15 2131  TempSrc:   PainSc: 4          Complications: No apparent anesthesia complications

## 2015-12-06 NOTE — Anesthesia Procedure Notes (Signed)
Procedure Name: LMA Insertion Date/Time: 12/06/2015 10:20 PM Performed by: Kismet Facemire S Pre-anesthesia Checklist: Patient identified, Emergency Drugs available, Suction available, Patient being monitored and Timeout performed Patient Re-evaluated:Patient Re-evaluated prior to inductionOxygen Delivery Method: Circle system utilized Preoxygenation: Pre-oxygenation with 100% oxygen Intubation Type: IV induction Ventilation: Mask ventilation without difficulty LMA: LMA inserted LMA Size: 4.0 Number of attempts: 1 Placement Confirmation: positive ETCO2 and breath sounds checked- equal and bilateral Tube secured with: Tape Dental Injury: Teeth and Oropharynx as per pre-operative assessment

## 2015-12-07 DIAGNOSIS — Z794 Long term (current) use of insulin: Secondary | ICD-10-CM

## 2015-12-07 DIAGNOSIS — Z7982 Long term (current) use of aspirin: Secondary | ICD-10-CM | POA: Diagnosis not present

## 2015-12-07 DIAGNOSIS — J340 Abscess, furuncle and carbuncle of nose: Principal | ICD-10-CM

## 2015-12-07 DIAGNOSIS — L03211 Cellulitis of face: Secondary | ICD-10-CM | POA: Diagnosis present

## 2015-12-07 DIAGNOSIS — Z79899 Other long term (current) drug therapy: Secondary | ICD-10-CM | POA: Diagnosis not present

## 2015-12-07 DIAGNOSIS — E119 Type 2 diabetes mellitus without complications: Secondary | ICD-10-CM

## 2015-12-07 LAB — GLUCOSE, CAPILLARY
GLUCOSE-CAPILLARY: 164 mg/dL — AB (ref 65–99)
GLUCOSE-CAPILLARY: 270 mg/dL — AB (ref 65–99)
GLUCOSE-CAPILLARY: 274 mg/dL — AB (ref 65–99)
Glucose-Capillary: 319 mg/dL — ABNORMAL HIGH (ref 65–99)

## 2015-12-07 MED ORDER — ATORVASTATIN CALCIUM 40 MG PO TABS
40.0000 mg | ORAL_TABLET | Freq: Every day | ORAL | Status: DC
  Administered 2015-12-07: 40 mg via ORAL
  Filled 2015-12-07: qty 1

## 2015-12-07 MED ORDER — ASPIRIN EC 81 MG PO TBEC
81.0000 mg | DELAYED_RELEASE_TABLET | Freq: Every day | ORAL | Status: DC
  Administered 2015-12-07: 81 mg via ORAL
  Filled 2015-12-07: qty 1

## 2015-12-07 MED ORDER — INSULIN GLARGINE 100 UNIT/ML ~~LOC~~ SOLN
20.0000 [IU] | Freq: Every day | SUBCUTANEOUS | Status: DC
  Administered 2015-12-07: 20 [IU] via SUBCUTANEOUS
  Filled 2015-12-07 (×3): qty 0.2

## 2015-12-07 MED ORDER — CANAGLIFLOZIN 100 MG PO TABS
100.0000 mg | ORAL_TABLET | Freq: Every day | ORAL | Status: DC
  Administered 2015-12-08: 100 mg via ORAL
  Filled 2015-12-07: qty 1

## 2015-12-07 MED ORDER — EZETIMIBE 10 MG PO TABS
10.0000 mg | ORAL_TABLET | Freq: Every day | ORAL | Status: DC
  Administered 2015-12-07: 10 mg via ORAL
  Filled 2015-12-07 (×2): qty 1

## 2015-12-07 NOTE — Anesthesia Postprocedure Evaluation (Signed)
Anesthesia Post Note  Patient: Anne FuLarry Pelly  Procedure(s) Performed: Procedure(s) (LRB): INCISION AND DRAINAGE NASAL ABSCESS (N/A)  Patient location during evaluation: PACU Anesthesia Type: General Level of consciousness: awake, awake and alert and oriented Pain management: pain level controlled Vital Signs Assessment: post-procedure vital signs reviewed and stable Respiratory status: spontaneous breathing, nonlabored ventilation and respiratory function stable Cardiovascular status: blood pressure returned to baseline Postop Assessment: no headache    Last Vitals:  Vitals:   12/06/15 2330 12/07/15 0002  BP: (!) 165/80 (!) 170/86  Pulse: 100 (!) 106  Resp: 14 18  Temp: 37.6 C (!) 38.8 C    Last Pain:  Vitals:   12/07/15 0551  TempSrc:   PainSc: 0-No pain                 Alane Hanssen COKER
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
## 2015-12-08 ENCOUNTER — Encounter (HOSPITAL_COMMUNITY): Payer: Self-pay | Admitting: Otolaryngology

## 2015-12-08 LAB — GLUCOSE, CAPILLARY: GLUCOSE-CAPILLARY: 145 mg/dL — AB (ref 65–99)

## 2015-12-08 NOTE — Progress Notes (Signed)
Patient discharged to home with instructions. 

## 2015-12-08 NOTE — Discharge Summary (Signed)
Physician Discharge Summary  Patient ID: Anne FuLarry Michelini MRN: [id number] DOB/AGE: [birth year] 54 y.o.  Admit date: 12/06/2015 Discharge date: 12/08/2015  Admission Diagnoses: Nasal abscess, diabetes  Discharge Diagnoses:  Principal Problem:   Nasal septal abscess Active Problems:   Diabetes mellitus, insulin dependent (IDDM), controlled (HCC)   Nasal abscess   Cellulitis of face   Discharged Condition: good  Hospital Course: 55 year old male with diabetes presented to ER with worsening nasal infection in spite of treatment with Bactrim and mupirocin ointment.  CT imaging and exam suggested abscess formation.  He was taken to the operating room for incision and drainage.  See operative note.  A Penrose drain was left in place and he was treated with broad spectrum IV antibiotics.  By POD 2, swelling had come down substantially and tenderness was improving.  He was felt stable for discharge after the drain was removed.  Consults: None  Significant Diagnostic Studies: radiology: CT scan: maxillofacial  Treatments: surgery: Incision and drainage of nasal abscess  Discharge Exam: Blood pressure 132/64, pulse 79, temperature 97.7 F (36.5 C), temperature source Oral, resp. rate 16, SpO2 98 %. General appearance: alert, cooperative and no distress Nose: Columellar fullness improving, less tender, no fluctuance, Penrose drain removed  Disposition: Final discharge disposition not confirmed      Signed: Rishabh Rinkenberger 12/08/2015, 8:52 AM

## 2015-12-12 LAB — AEROBIC/ANAEROBIC CULTURE (SURGICAL/DEEP WOUND)

## 2015-12-12 LAB — AEROBIC/ANAEROBIC CULTURE W GRAM STAIN (SURGICAL/DEEP WOUND)

## 2016-03-26 DIAGNOSIS — Z6825 Body mass index (BMI) 25.0-25.9, adult: Secondary | ICD-10-CM | POA: Diagnosis not present

## 2016-03-26 DIAGNOSIS — E109 Type 1 diabetes mellitus without complications: Secondary | ICD-10-CM | POA: Diagnosis not present

## 2016-03-26 DIAGNOSIS — E1065 Type 1 diabetes mellitus with hyperglycemia: Secondary | ICD-10-CM | POA: Diagnosis not present

## 2016-04-08 DIAGNOSIS — R7309 Other abnormal glucose: Secondary | ICD-10-CM | POA: Diagnosis not present

## 2016-07-02 DIAGNOSIS — E784 Other hyperlipidemia: Secondary | ICD-10-CM | POA: Diagnosis not present

## 2016-07-02 DIAGNOSIS — E11319 Type 2 diabetes mellitus with unspecified diabetic retinopathy without macular edema: Secondary | ICD-10-CM | POA: Diagnosis not present

## 2016-07-02 DIAGNOSIS — Z6826 Body mass index (BMI) 26.0-26.9, adult: Secondary | ICD-10-CM | POA: Diagnosis not present

## 2016-07-02 DIAGNOSIS — E1065 Type 1 diabetes mellitus with hyperglycemia: Secondary | ICD-10-CM | POA: Diagnosis not present

## 2016-10-14 DIAGNOSIS — E11319 Type 2 diabetes mellitus with unspecified diabetic retinopathy without macular edema: Secondary | ICD-10-CM | POA: Diagnosis not present

## 2016-10-14 DIAGNOSIS — Z794 Long term (current) use of insulin: Secondary | ICD-10-CM | POA: Diagnosis not present

## 2016-10-14 DIAGNOSIS — E1065 Type 1 diabetes mellitus with hyperglycemia: Secondary | ICD-10-CM | POA: Diagnosis not present

## 2016-10-14 DIAGNOSIS — E784 Other hyperlipidemia: Secondary | ICD-10-CM | POA: Diagnosis not present

## 2016-10-14 DIAGNOSIS — Z1389 Encounter for screening for other disorder: Secondary | ICD-10-CM | POA: Diagnosis not present

## 2016-10-22 ENCOUNTER — Ambulatory Visit (INDEPENDENT_AMBULATORY_CARE_PROVIDER_SITE_OTHER): Payer: BLUE CROSS/BLUE SHIELD | Admitting: Ophthalmology

## 2016-10-22 DIAGNOSIS — E103291 Type 1 diabetes mellitus with mild nonproliferative diabetic retinopathy without macular edema, right eye: Secondary | ICD-10-CM

## 2016-10-22 DIAGNOSIS — H43813 Vitreous degeneration, bilateral: Secondary | ICD-10-CM | POA: Diagnosis not present

## 2016-10-22 DIAGNOSIS — E103392 Type 1 diabetes mellitus with moderate nonproliferative diabetic retinopathy without macular edema, left eye: Secondary | ICD-10-CM

## 2016-10-22 DIAGNOSIS — E11319 Type 2 diabetes mellitus with unspecified diabetic retinopathy without macular edema: Secondary | ICD-10-CM

## 2016-10-22 DIAGNOSIS — H2513 Age-related nuclear cataract, bilateral: Secondary | ICD-10-CM

## 2016-12-17 DIAGNOSIS — Z0000 Encounter for general adult medical examination without abnormal findings: Secondary | ICD-10-CM | POA: Diagnosis not present

## 2016-12-17 DIAGNOSIS — E559 Vitamin D deficiency, unspecified: Secondary | ICD-10-CM | POA: Diagnosis not present

## 2016-12-17 DIAGNOSIS — E7849 Other hyperlipidemia: Secondary | ICD-10-CM | POA: Diagnosis not present

## 2016-12-17 DIAGNOSIS — Z125 Encounter for screening for malignant neoplasm of prostate: Secondary | ICD-10-CM | POA: Diagnosis not present

## 2016-12-17 DIAGNOSIS — E1065 Type 1 diabetes mellitus with hyperglycemia: Secondary | ICD-10-CM | POA: Diagnosis not present

## 2016-12-24 DIAGNOSIS — Z6825 Body mass index (BMI) 25.0-25.9, adult: Secondary | ICD-10-CM | POA: Diagnosis not present

## 2016-12-24 DIAGNOSIS — E1065 Type 1 diabetes mellitus with hyperglycemia: Secondary | ICD-10-CM | POA: Diagnosis not present

## 2016-12-28 DIAGNOSIS — Z1212 Encounter for screening for malignant neoplasm of rectum: Secondary | ICD-10-CM | POA: Diagnosis not present

## 2017-03-25 DIAGNOSIS — E7849 Other hyperlipidemia: Secondary | ICD-10-CM | POA: Diagnosis not present

## 2017-03-25 DIAGNOSIS — E11319 Type 2 diabetes mellitus with unspecified diabetic retinopathy without macular edema: Secondary | ICD-10-CM | POA: Diagnosis not present

## 2017-03-25 DIAGNOSIS — Z23 Encounter for immunization: Secondary | ICD-10-CM | POA: Diagnosis not present

## 2017-03-25 DIAGNOSIS — Z794 Long term (current) use of insulin: Secondary | ICD-10-CM | POA: Diagnosis not present

## 2017-03-25 DIAGNOSIS — M25522 Pain in left elbow: Secondary | ICD-10-CM | POA: Diagnosis not present

## 2017-03-25 DIAGNOSIS — Z1389 Encounter for screening for other disorder: Secondary | ICD-10-CM | POA: Diagnosis not present

## 2017-03-25 DIAGNOSIS — E559 Vitamin D deficiency, unspecified: Secondary | ICD-10-CM | POA: Diagnosis not present

## 2017-03-25 DIAGNOSIS — E1065 Type 1 diabetes mellitus with hyperglycemia: Secondary | ICD-10-CM | POA: Diagnosis not present

## 2017-03-25 DIAGNOSIS — Z0000 Encounter for general adult medical examination without abnormal findings: Secondary | ICD-10-CM | POA: Diagnosis not present

## 2017-04-12 DIAGNOSIS — M5136 Other intervertebral disc degeneration, lumbar region: Secondary | ICD-10-CM | POA: Diagnosis not present

## 2017-06-17 DIAGNOSIS — E109 Type 1 diabetes mellitus without complications: Secondary | ICD-10-CM | POA: Diagnosis not present

## 2017-06-17 DIAGNOSIS — E7849 Other hyperlipidemia: Secondary | ICD-10-CM | POA: Diagnosis not present

## 2017-06-17 DIAGNOSIS — Z794 Long term (current) use of insulin: Secondary | ICD-10-CM | POA: Diagnosis not present

## 2017-06-17 DIAGNOSIS — E1065 Type 1 diabetes mellitus with hyperglycemia: Secondary | ICD-10-CM | POA: Diagnosis not present

## 2017-09-23 DIAGNOSIS — E1065 Type 1 diabetes mellitus with hyperglycemia: Secondary | ICD-10-CM | POA: Diagnosis not present

## 2017-09-23 DIAGNOSIS — E7849 Other hyperlipidemia: Secondary | ICD-10-CM | POA: Diagnosis not present

## 2017-09-23 DIAGNOSIS — Z794 Long term (current) use of insulin: Secondary | ICD-10-CM | POA: Diagnosis not present

## 2017-09-23 DIAGNOSIS — Z6825 Body mass index (BMI) 25.0-25.9, adult: Secondary | ICD-10-CM | POA: Diagnosis not present

## 2017-10-28 ENCOUNTER — Encounter (INDEPENDENT_AMBULATORY_CARE_PROVIDER_SITE_OTHER): Payer: BLUE CROSS/BLUE SHIELD | Admitting: Ophthalmology

## 2017-10-28 DIAGNOSIS — H43813 Vitreous degeneration, bilateral: Secondary | ICD-10-CM

## 2017-10-28 DIAGNOSIS — E103291 Type 1 diabetes mellitus with mild nonproliferative diabetic retinopathy without macular edema, right eye: Secondary | ICD-10-CM | POA: Diagnosis not present

## 2017-10-28 DIAGNOSIS — E103392 Type 1 diabetes mellitus with moderate nonproliferative diabetic retinopathy without macular edema, left eye: Secondary | ICD-10-CM | POA: Diagnosis not present

## 2017-10-28 DIAGNOSIS — E10319 Type 1 diabetes mellitus with unspecified diabetic retinopathy without macular edema: Secondary | ICD-10-CM

## 2017-10-28 DIAGNOSIS — H2513 Age-related nuclear cataract, bilateral: Secondary | ICD-10-CM

## 2018-01-20 DIAGNOSIS — E7849 Other hyperlipidemia: Secondary | ICD-10-CM | POA: Diagnosis not present

## 2018-01-20 DIAGNOSIS — Z794 Long term (current) use of insulin: Secondary | ICD-10-CM | POA: Diagnosis not present

## 2018-01-20 DIAGNOSIS — E559 Vitamin D deficiency, unspecified: Secondary | ICD-10-CM | POA: Diagnosis not present

## 2018-01-20 DIAGNOSIS — E1065 Type 1 diabetes mellitus with hyperglycemia: Secondary | ICD-10-CM | POA: Diagnosis not present

## 2018-01-31 DIAGNOSIS — R05 Cough: Secondary | ICD-10-CM | POA: Diagnosis not present

## 2018-01-31 DIAGNOSIS — E1065 Type 1 diabetes mellitus with hyperglycemia: Secondary | ICD-10-CM | POA: Diagnosis not present

## 2018-01-31 DIAGNOSIS — Z6825 Body mass index (BMI) 25.0-25.9, adult: Secondary | ICD-10-CM | POA: Diagnosis not present

## 2018-01-31 DIAGNOSIS — J329 Chronic sinusitis, unspecified: Secondary | ICD-10-CM | POA: Diagnosis not present

## 2018-03-24 DIAGNOSIS — Z125 Encounter for screening for malignant neoplasm of prostate: Secondary | ICD-10-CM | POA: Diagnosis not present

## 2018-03-24 DIAGNOSIS — E559 Vitamin D deficiency, unspecified: Secondary | ICD-10-CM | POA: Diagnosis not present

## 2018-03-24 DIAGNOSIS — Z0000 Encounter for general adult medical examination without abnormal findings: Secondary | ICD-10-CM | POA: Diagnosis not present

## 2018-03-24 DIAGNOSIS — R82998 Other abnormal findings in urine: Secondary | ICD-10-CM | POA: Diagnosis not present

## 2018-03-24 DIAGNOSIS — E1065 Type 1 diabetes mellitus with hyperglycemia: Secondary | ICD-10-CM | POA: Diagnosis not present

## 2018-03-31 DIAGNOSIS — E559 Vitamin D deficiency, unspecified: Secondary | ICD-10-CM | POA: Diagnosis not present

## 2018-03-31 DIAGNOSIS — E11319 Type 2 diabetes mellitus with unspecified diabetic retinopathy without macular edema: Secondary | ICD-10-CM | POA: Diagnosis not present

## 2018-03-31 DIAGNOSIS — E1065 Type 1 diabetes mellitus with hyperglycemia: Secondary | ICD-10-CM | POA: Diagnosis not present

## 2018-03-31 DIAGNOSIS — Z794 Long term (current) use of insulin: Secondary | ICD-10-CM | POA: Diagnosis not present

## 2018-03-31 DIAGNOSIS — Z1331 Encounter for screening for depression: Secondary | ICD-10-CM | POA: Diagnosis not present

## 2018-03-31 DIAGNOSIS — Z0000 Encounter for general adult medical examination without abnormal findings: Secondary | ICD-10-CM | POA: Diagnosis not present

## 2018-04-05 DIAGNOSIS — Z1212 Encounter for screening for malignant neoplasm of rectum: Secondary | ICD-10-CM | POA: Diagnosis not present
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
## 2018-10-27 ENCOUNTER — Encounter (INDEPENDENT_AMBULATORY_CARE_PROVIDER_SITE_OTHER): Payer: BC Managed Care – PPO | Admitting: Ophthalmology

## 2018-10-27 ENCOUNTER — Other Ambulatory Visit: Payer: Self-pay

## 2018-10-27 DIAGNOSIS — E103291 Type 1 diabetes mellitus with mild nonproliferative diabetic retinopathy without macular edema, right eye: Secondary | ICD-10-CM

## 2018-10-27 DIAGNOSIS — H43813 Vitreous degeneration, bilateral: Secondary | ICD-10-CM

## 2018-10-27 DIAGNOSIS — H2513 Age-related nuclear cataract, bilateral: Secondary | ICD-10-CM

## 2018-10-27 DIAGNOSIS — E10319 Type 1 diabetes mellitus with unspecified diabetic retinopathy without macular edema: Secondary | ICD-10-CM

## 2018-10-27 DIAGNOSIS — E103392 Type 1 diabetes mellitus with moderate nonproliferative diabetic retinopathy without macular edema, left eye: Secondary | ICD-10-CM | POA: Diagnosis not present

## 2019-02-02 DIAGNOSIS — E1065 Type 1 diabetes mellitus with hyperglycemia: Secondary | ICD-10-CM | POA: Diagnosis not present

## 2019-02-02 DIAGNOSIS — Z794 Long term (current) use of insulin: Secondary | ICD-10-CM | POA: Diagnosis not present

## 2019-02-02 DIAGNOSIS — E10319 Type 1 diabetes mellitus with unspecified diabetic retinopathy without macular edema: Secondary | ICD-10-CM | POA: Diagnosis not present

## 2019-02-02 DIAGNOSIS — E785 Hyperlipidemia, unspecified: Secondary | ICD-10-CM | POA: Diagnosis not present

## 2019-04-06 DIAGNOSIS — Z0000 Encounter for general adult medical examination without abnormal findings: Secondary | ICD-10-CM | POA: Diagnosis not present

## 2019-04-06 DIAGNOSIS — E7849 Other hyperlipidemia: Secondary | ICD-10-CM | POA: Diagnosis not present

## 2019-04-06 DIAGNOSIS — E109 Type 1 diabetes mellitus without complications: Secondary | ICD-10-CM | POA: Diagnosis not present

## 2019-04-06 DIAGNOSIS — E559 Vitamin D deficiency, unspecified: Secondary | ICD-10-CM | POA: Diagnosis not present

## 2019-07-20 DIAGNOSIS — R030 Elevated blood-pressure reading, without diagnosis of hypertension: Secondary | ICD-10-CM | POA: Diagnosis not present

## 2019-07-20 DIAGNOSIS — E10319 Type 1 diabetes mellitus with unspecified diabetic retinopathy without macular edema: Secondary | ICD-10-CM | POA: Diagnosis not present

## 2019-10-12 DIAGNOSIS — E10319 Type 1 diabetes mellitus with unspecified diabetic retinopathy without macular edema: Secondary | ICD-10-CM | POA: Diagnosis not present

## 2019-10-12 DIAGNOSIS — E162 Hypoglycemia, unspecified: Secondary | ICD-10-CM | POA: Diagnosis not present

## 2019-10-12 DIAGNOSIS — Z794 Long term (current) use of insulin: Secondary | ICD-10-CM | POA: Diagnosis not present

## 2019-10-26 ENCOUNTER — Encounter (INDEPENDENT_AMBULATORY_CARE_PROVIDER_SITE_OTHER): Payer: BC Managed Care – PPO | Admitting: Ophthalmology

## 2019-10-26 ENCOUNTER — Other Ambulatory Visit: Payer: Self-pay

## 2019-10-26 DIAGNOSIS — H2513 Age-related nuclear cataract, bilateral: Secondary | ICD-10-CM

## 2019-10-26 DIAGNOSIS — E103392 Type 1 diabetes mellitus with moderate nonproliferative diabetic retinopathy without macular edema, left eye: Secondary | ICD-10-CM | POA: Diagnosis not present

## 2019-10-26 DIAGNOSIS — E103291 Type 1 diabetes mellitus with mild nonproliferative diabetic retinopathy without macular edema, right eye: Secondary | ICD-10-CM | POA: Diagnosis not present

## 2019-10-26 DIAGNOSIS — H43813 Vitreous degeneration, bilateral: Secondary | ICD-10-CM | POA: Diagnosis not present

## 2019-10-26 DIAGNOSIS — E10319 Type 1 diabetes mellitus with unspecified diabetic retinopathy without macular edema: Secondary | ICD-10-CM

## 2019-12-03 DIAGNOSIS — E113392 Type 2 diabetes mellitus with moderate nonproliferative diabetic retinopathy without macular edema, left eye: Secondary | ICD-10-CM | POA: Diagnosis not present

## 2019-12-03 DIAGNOSIS — H2513 Age-related nuclear cataract, bilateral: Secondary | ICD-10-CM | POA: Diagnosis not present

## 2019-12-03 DIAGNOSIS — E113291 Type 2 diabetes mellitus with mild nonproliferative diabetic retinopathy without macular edema, right eye: Secondary | ICD-10-CM | POA: Diagnosis not present

## 2019-12-03 DIAGNOSIS — H2511 Age-related nuclear cataract, right eye: Secondary | ICD-10-CM | POA: Diagnosis not present

## 2019-12-03 DIAGNOSIS — H25013 Cortical age-related cataract, bilateral: Secondary | ICD-10-CM | POA: Diagnosis not present

## 2019-12-03 DIAGNOSIS — H2589 Other age-related cataract: Secondary | ICD-10-CM | POA: Diagnosis not present

## 2020-01-01 DIAGNOSIS — H25811 Combined forms of age-related cataract, right eye: Secondary | ICD-10-CM | POA: Diagnosis not present

## 2020-01-01 DIAGNOSIS — H2511 Age-related nuclear cataract, right eye: Secondary | ICD-10-CM | POA: Diagnosis not present

## 2020-01-07 DIAGNOSIS — H40013 Open angle with borderline findings, low risk, bilateral: Secondary | ICD-10-CM | POA: Diagnosis not present

## 2020-01-15 DIAGNOSIS — E10319 Type 1 diabetes mellitus with unspecified diabetic retinopathy without macular edema: Secondary | ICD-10-CM | POA: Diagnosis not present

## 2020-01-15 DIAGNOSIS — Z0000 Encounter for general adult medical examination without abnormal findings: Secondary | ICD-10-CM | POA: Diagnosis not present

## 2020-01-15 DIAGNOSIS — E559 Vitamin D deficiency, unspecified: Secondary | ICD-10-CM | POA: Diagnosis not present

## 2020-01-15 DIAGNOSIS — Z125 Encounter for screening for malignant neoplasm of prostate: Secondary | ICD-10-CM | POA: Diagnosis not present

## 2020-01-15 DIAGNOSIS — E785 Hyperlipidemia, unspecified: Secondary | ICD-10-CM | POA: Diagnosis not present

## 2020-01-22 DIAGNOSIS — R82998 Other abnormal findings in urine: Secondary | ICD-10-CM | POA: Diagnosis not present

## 2020-01-22 DIAGNOSIS — Z0000 Encounter for general adult medical examination without abnormal findings: Secondary | ICD-10-CM | POA: Diagnosis not present

## 2020-01-22 DIAGNOSIS — E10319 Type 1 diabetes mellitus with unspecified diabetic retinopathy without macular edema: Secondary | ICD-10-CM | POA: Diagnosis not present

## 2020-01-22 DIAGNOSIS — E785 Hyperlipidemia, unspecified: Secondary | ICD-10-CM | POA: Diagnosis not present

## 2020-02-12 DIAGNOSIS — Z1212 Encounter for screening for malignant neoplasm of rectum: Secondary | ICD-10-CM | POA: Diagnosis not present

## 2020-05-21 DIAGNOSIS — E10319 Type 1 diabetes mellitus with unspecified diabetic retinopathy without macular edema: Secondary | ICD-10-CM | POA: Diagnosis not present

## 2020-05-21 DIAGNOSIS — E1065 Type 1 diabetes mellitus with hyperglycemia: Secondary | ICD-10-CM | POA: Diagnosis not present

## 2020-09-23 DIAGNOSIS — E785 Hyperlipidemia, unspecified: Secondary | ICD-10-CM | POA: Diagnosis not present

## 2020-09-23 DIAGNOSIS — E1065 Type 1 diabetes mellitus with hyperglycemia: Secondary | ICD-10-CM | POA: Diagnosis not present

## 2020-10-27 ENCOUNTER — Encounter (INDEPENDENT_AMBULATORY_CARE_PROVIDER_SITE_OTHER): Payer: BC Managed Care – PPO | Admitting: Ophthalmology

## 2021-02-03 DIAGNOSIS — E1065 Type 1 diabetes mellitus with hyperglycemia: Secondary | ICD-10-CM | POA: Diagnosis not present

## 2021-02-03 DIAGNOSIS — E559 Vitamin D deficiency, unspecified: Secondary | ICD-10-CM | POA: Diagnosis not present

## 2021-02-03 DIAGNOSIS — E785 Hyperlipidemia, unspecified: Secondary | ICD-10-CM | POA: Diagnosis not present

## 2021-02-03 DIAGNOSIS — Z125 Encounter for screening for malignant neoplasm of prostate: Secondary | ICD-10-CM | POA: Diagnosis not present

## 2021-02-10 DIAGNOSIS — E1065 Type 1 diabetes mellitus with hyperglycemia: Secondary | ICD-10-CM | POA: Diagnosis not present

## 2021-02-10 DIAGNOSIS — E10319 Type 1 diabetes mellitus with unspecified diabetic retinopathy without macular edema: Secondary | ICD-10-CM | POA: Diagnosis not present

## 2021-02-10 DIAGNOSIS — Z1339 Encounter for screening examination for other mental health and behavioral disorders: Secondary | ICD-10-CM | POA: Diagnosis not present

## 2021-02-10 DIAGNOSIS — Z1331 Encounter for screening for depression: Secondary | ICD-10-CM | POA: Diagnosis not present

## 2021-02-10 DIAGNOSIS — Z0000 Encounter for general adult medical examination without abnormal findings: Secondary | ICD-10-CM | POA: Diagnosis not present

## 2021-04-02 DIAGNOSIS — H52203 Unspecified astigmatism, bilateral: Secondary | ICD-10-CM | POA: Diagnosis not present

## 2021-04-02 DIAGNOSIS — E113293 Type 2 diabetes mellitus with mild nonproliferative diabetic retinopathy without macular edema, bilateral: Secondary | ICD-10-CM | POA: Diagnosis not present

## 2021-06-12 DIAGNOSIS — E10319 Type 1 diabetes mellitus with unspecified diabetic retinopathy without macular edema: Secondary | ICD-10-CM | POA: Diagnosis not present

## 2021-06-12 DIAGNOSIS — Z1339 Encounter for screening examination for other mental health and behavioral disorders: Secondary | ICD-10-CM | POA: Diagnosis not present

## 2021-09-23 DIAGNOSIS — E10319 Type 1 diabetes mellitus with unspecified diabetic retinopathy without macular edema: Secondary | ICD-10-CM | POA: Diagnosis not present

## 2021-09-23 DIAGNOSIS — Z1339 Encounter for screening examination for other mental health and behavioral disorders: Secondary | ICD-10-CM | POA: Diagnosis not present
